# Patient Record
Sex: Female | Born: 1945 | Hispanic: No | Marital: Single | Smoking: Never smoker
Health system: Southern US, Community
[De-identification: ages and names within clinical notes are randomized; demographics above are authoritative.]

## PROBLEM LIST (undated history)

## (undated) DIAGNOSIS — E119 Type 2 diabetes mellitus without complications: Secondary | ICD-10-CM

## (undated) DIAGNOSIS — G589 Mononeuropathy, unspecified: Secondary | ICD-10-CM

## (undated) HISTORY — DX: Mononeuropathy, unspecified: G58.9

## (undated) HISTORY — DX: Type 2 diabetes mellitus without complications: E11.9

---

## 2011-03-25 HISTORY — PX: CATARACT EXTRACTION, BILATERAL: SHX1313

## 2011-08-20 ENCOUNTER — Encounter (INDEPENDENT_AMBULATORY_CARE_PROVIDER_SITE_OTHER): Payer: Self-pay

## 2011-08-20 ENCOUNTER — Ambulatory Visit (INDEPENDENT_AMBULATORY_CARE_PROVIDER_SITE_OTHER): Payer: Commercial Managed Care - PPO | Admitting: Family Medicine

## 2011-08-20 VITALS — BP 112/70 | HR 57 | Temp 98.2°F | Resp 16 | Ht 60.0 in | Wt 137.0 lb

## 2011-08-20 DIAGNOSIS — L237 Allergic contact dermatitis due to plants, except food: Secondary | ICD-10-CM

## 2011-08-20 DIAGNOSIS — L255 Unspecified contact dermatitis due to plants, except food: Secondary | ICD-10-CM

## 2011-08-20 MED ORDER — PREDNISONE 20 MG PO TABS
ORAL_TABLET | ORAL | Status: DC
Start: 2011-08-20 — End: 2011-08-27

## 2011-08-20 NOTE — Patient Instructions (Signed)
Take pred taper as we discussed    No oral OTC meds    If not improving please follow-up
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
## 2011-08-27 ENCOUNTER — Encounter (INDEPENDENT_AMBULATORY_CARE_PROVIDER_SITE_OTHER): Payer: Self-pay

## 2011-08-27 ENCOUNTER — Ambulatory Visit (INDEPENDENT_AMBULATORY_CARE_PROVIDER_SITE_OTHER): Payer: Commercial Managed Care - PPO | Admitting: Family Medicine

## 2011-08-27 VITALS — BP 136/69 | HR 67 | Temp 98.1°F | Resp 18 | Ht 60.0 in | Wt 137.0 lb

## 2011-08-27 DIAGNOSIS — R42 Dizziness and giddiness: Secondary | ICD-10-CM

## 2011-08-27 DIAGNOSIS — L255 Unspecified contact dermatitis due to plants, except food: Secondary | ICD-10-CM

## 2011-08-27 DIAGNOSIS — L237 Allergic contact dermatitis due to plants, except food: Secondary | ICD-10-CM

## 2011-08-27 MED ORDER — PREDNISONE 20 MG PO TABS
ORAL_TABLET | ORAL | Status: AC
Start: 2011-08-27 — End: 2011-09-06

## 2011-08-27 MED ORDER — MECLIZINE HCL 12.5 MG PO TABS
25.00 mg | ORAL_TABLET | Freq: Three times a day (TID) | ORAL | Status: AC | PRN
Start: 2011-08-27 — End: 2011-09-06
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
## 2011-08-27 NOTE — Patient Instructions (Signed)
Vertigo    NEUROLOGIST may also be helpful in making sure no more dangerous cause of episodes of leaning to the right.     Dr. Gilford Raid  [phone]    You have been diagnosed with vertigo.    Vertigo means "the feeling of spinning." People with vertigo have an intense feeling that the room is spinning. This is often called "dizziness."    Most of the time the cause of vertigo is not serious. The most common cause is a balance problem in the inner ear.    The usual treatment is medication to help relieve the spinning feeling and to control nausea.    DO NOT drive a motor vehicle or operate any other equipment that requires concentration until your symptoms have resolved. Be very careful going up and down stairs.    If your symptoms continue your doctor may order an MRI of your brain to make sure there is not a more serious cause of your vertigo.    YOU SHOULD SEEK MEDICAL ATTENTION IMMEDIATELY, EITHER HERE OR AT THE NEAREST EMERGENCY DEPARTMENT, IF ANY OF THE FOLLOWING OCCURS:   You feel numbness, tingling, or weakness in your arms or legs or become unable to walk.   Your symptoms become worse, even with medication.   You have a severe headache.   You have vomiting that makes it hard to take or keep down medication.

## 2013-02-21 ENCOUNTER — Encounter (INDEPENDENT_AMBULATORY_CARE_PROVIDER_SITE_OTHER): Payer: Self-pay

## 2013-02-21 ENCOUNTER — Ambulatory Visit (INDEPENDENT_AMBULATORY_CARE_PROVIDER_SITE_OTHER): Payer: Commercial Managed Care - PPO | Admitting: Adult Health

## 2013-02-21 VITALS — BP 91/53 | HR 67 | Temp 97.9°F | Resp 16 | Ht 60.0 in | Wt 127.0 lb

## 2013-02-21 DIAGNOSIS — Z029 Encounter for administrative examinations, unspecified: Secondary | ICD-10-CM

## 2013-02-21 DIAGNOSIS — J209 Acute bronchitis, unspecified: Secondary | ICD-10-CM

## 2013-02-21 DIAGNOSIS — H6690 Otitis media, unspecified, unspecified ear: Secondary | ICD-10-CM

## 2013-02-21 DIAGNOSIS — H6693 Otitis media, unspecified, bilateral: Secondary | ICD-10-CM

## 2013-02-21 DIAGNOSIS — R059 Cough, unspecified: Secondary | ICD-10-CM

## 2013-02-21 MED ORDER — GUAIFENESIN-CODEINE 100-10 MG/5ML PO SOLN
5.0000 mL | Freq: Three times a day (TID) | ORAL | Status: DC | PRN
Start: 2013-02-21 — End: 2023-07-08

## 2013-02-21 MED ORDER — AMOXICILLIN-POT CLAVULANATE 875-125 MG PO TABS
1.00 | ORAL_TABLET | Freq: Two times a day (BID) | ORAL | Status: AC
Start: 2013-02-21 — End: 2013-03-03

## 2013-02-21 MED ORDER — ALBUTEROL SULFATE HFA 108 (90 BASE) MCG/ACT IN AERS
2.00 | INHALATION_SPRAY | Freq: Four times a day (QID) | RESPIRATORY_TRACT | Status: AC | PRN
Start: 2013-02-21 — End: 2014-02-21

## 2013-02-21 NOTE — Progress Notes (Signed)
Subjective:       Patient ID: Adrienne Wang is a 67 y.o. female.  Chief Complaint   Patient presents with   . URI     patient states she gets a deep cough about once a year, started Tuesday night of last week, had headache and felt weak, was in bed 2.5 days, no flu vaccine       URI   This is a new problem. The current episode started in the past 7 days. The problem has been gradually worsening. There has been no fever. Associated symptoms include congestion, coughing, headaches, rhinorrhea, sinus pain, a sore throat and swollen glands. She has tried nothing for the symptoms. The treatment provided no relief.       The following portions of the patient's history were reviewed and updated as appropriate: allergies, current medications, past family history, past medical history, past social history, past surgical history and problem list.    Review of Systems   Constitutional: Positive for chills, activity change and fatigue.   HENT: Positive for congestion, rhinorrhea and sore throat.    Respiratory: Positive for cough.    Neurological: Positive for headaches.   All other systems reviewed and are negative.            Objective:     Physical Exam   Constitutional: She appears well-developed and well-nourished. She is active. She appears ill.   HENT:   Head: Normocephalic.   Right Ear: Tympanic membrane is bulging.   Left Ear: Tympanic membrane is erythematous and bulging.   Nose: Mucosal edema and rhinorrhea present.   Mouth/Throat: Uvula is midline. Posterior oropharyngeal edema and posterior oropharyngeal erythema present.   Eyes: Pupils are equal, round, and reactive to light.   Cardiovascular: Normal rate, regular rhythm, S1 normal and S2 normal.    Pulmonary/Chest: Effort normal. She has decreased breath sounds in the right middle field, the right lower field, the left middle field and the left lower field. She has wheezes in the right middle field, the right lower field, the left middle field and the left lower  field.   Lymphadenopathy:        Head (right side): Submandibular and tonsillar adenopathy present.        Head (left side): Submandibular and tonsillar adenopathy present.   Neurological: She is alert.   Skin: Skin is warm, dry and intact.           Assessment:       1. Cough     2. Otitis media, bilateral  amoxicillin-clavulanate (AUGMENTIN) 875-125 MG per tablet    albuterol (PROVENTIL HFA;VENTOLIN HFA) 108 (90 BASE) MCG/ACT inhaler    Guaifenesin-Codeine (ROBITUSSIN W CODEINE) 100-10 MG/5ML syrup   3. Acute bronchitis  amoxicillin-clavulanate (AUGMENTIN) 875-125 MG per tablet    albuterol (PROVENTIL HFA;VENTOLIN HFA) 108 (90 BASE) MCG/ACT inhaler    Guaifenesin-Codeine (ROBITUSSIN W CODEINE) 100-10 MG/5ML syrup           Plan:        Medicines as prescribed .    Follow-up w/ PMD

## 2013-02-21 NOTE — Patient Instructions (Signed)
Bronchitis With Wheezing (Viral Or Bacterial: Adult)    Bronchitis is an infection of the air passages. It often occurs during the common cold and is usually caused by a virus. Symptoms include cough with mucus (phlegm) and low-grade fever.  If there is a lot of inflammation, air flow is restricted. The air passages may also go into spasm, especially if you are an asthmatic. This causes wheezing and difficulty breathing even in persons who do not have asthma.  Bronchitis usually lasts 7-14 days. The wheezing should improve with treatment during the first week. An inhaler is often prescribed to relax the air passages and stop wheezing. Antibiotics will be prescribed if your doctor thinks there is also a secondary bacterial infection.  Home Care:   If symptoms are severe, rest at home for the first 2-3 days. When resuming activity, don't let yourself become overly tired.   Do not smoke and avoid exposure to the smoke of others.   You may use acetaminophen (Tylenol) or ibuprofen (Motrin, Advil) to control fever, unless another medicine was prescribed. [NOTE: If you have chronic liver or kidney disease or ever had a stomach ulcer or GI bleeding, talk with your doctor before using these medicines.] (Aspirin should never be used in anyone under 18 years of age who is ill with a fever. It may cause severe liver damage.)   Your appetite may be poor so a light diet is fine. Avoid dehydration by drinking 6-8 glasses of fluids per day (water, soft, drinks, juices, tea, soup, etc.). Extra fluids will help loosen secretions in the lungs.   Over-the-counter cough medicines that contain"dextromethorphan"(such as Robitussin DM) and decongestants (Actifed or Sudafed) may help relieve cough and congestion. [NOTE: Do not use decongestants if you have high blood pressure.]   If you were given an inhaler, use it exactly as directed. If you need to use it more often than prescribed, your condition may be worsening. Contact your  doctor or this facility.   If prescribed, finish all antibiotic medicine, even if you are feeling better after only a few days.  Follow Up  With Your Doctor Or As Directed If You Are Not Starting To Feel Better After Three Days.  [NOTE: If you are age 65 or older, or if you have chronic asthma or COPD, we recommend a pneumococcal vaccination every five years and a yearly influenza vaccination (flu shot) every autumn. Ask your doctor about this. If you had an x-ray or EKG (electrocardiogram), it will be reviewed by a specialist. You will be notified of any new findings that may affect your care.]  Get Prompt Medical Attention If Any Of The Following Occur:   Increased wheezing, shortness of breath or pain with breathing   Fever of 100.4F (38C) oral or higher, not better with fever medication   Coughing up blood or increasing amounts of colored sputum   Weakness, drowsiness, headache, facial pain, ear pain or a stiff neck   Lower leg swelling, tenderness, redness or pain   2000-2014 [employer], [address], [place] [address]. All rights reserved. This information is not intended as a substitute for professional medical care. Always follow your healthcare professional's instructions.      Sinusitis [Abx Tx]    The sinuses are air-filled spaces within the bones of the face. They connect to the inside of the nose. Sinusitis is an inflammation of the tissue lining the sinus cavity. Sinus inflammation can occur during a cold or hay-fever (allergies to   pollens and other particles in the air) and cause symptoms of sinus congestion and fullness. A sinus infection causes fever, headache and facial pain. There is usually green or yellow drainage from the nose or into the back of the throat (post-nasal drip). Antibiotics are prescribed to treat this condition.  Home Care:   Drink plenty of water, hot tea, and other liquids to stay well hydrated. This thins the mucus and promotes sinus drainage.   Apply  heat to the painful areas of the face. Use a towel soaked in hot water. Or, stand in the shower and direct the hot spray onto your face. This is a good way to inhale warm water vapor and get heat on your face at the same time. (Cover your mouth and nose with your hands so you can still breathe as you do this.)   Use a vaporizer with products such as Vicks VapoRub (contains menthol) at night. Suck on peppermint, menthol or eucalyptus hard candies during the day.   An expectorant containing guaifenesin (such as Robitussin), helps to thin the mucus and promote drainage from the sinuses.   Over-the-counter decongestants may be used unless a similar medicine was prescribed. Nasal sprays work the fastest. Use one that contains phenylephrine (Neo-synephrine, Sinex and others) or oxymetazoline (Afrin). First blow the nose gently to remove mucus, then apply the drops. Do not use these medicines more often than directed on the label or for more than three days or symptoms may worsen. You may also use tablets containing pseudoephedrine (Sudafed). Many sinus remedies combine ingredients, which may increase side effects. Read the labels or ask the pharmacist for help. NOTE: Persons with high blood pressure should not use decongestants. They can raise blood pressure.   Antihistamines are useful if allergies are a cause of your sinusitis. The mildest one is chlorpheniramine (available without a prescription). The dose for adults is 8-12mg three times a day. [NOTE: Do not use chlorpheniramine if you have glaucoma or if you are a man with trouble urinating due to an enlarged prostate.] Claritin (loratidine) is an antihistamine that causes less drowsiness and is a good alternative for daytime use.   Do not use nasal rinses or irrigation during an acute sinus infection, unless advised by your doctor. Rinsing may spread the infection to other sinuses.   You may use acetaminophen (Tylenol) or ibuprofen (Motrin, Advil) to control  pain, unless another pain medicine was prescribed. [ NOTE: If you have chronic liver or kidney disease or ever had a stomach ulcer, talk with your doctor before using these medicines.] (Aspirin should never be used in anyone under 18 years of age who is ill with a fever. It may cause severe liver damage.)   Finish the full course, even if you are feeling better after a few days.  Follow Up  with your doctor or this facility in one week or as instructed by our staff if not improving.  Get Prompt Medical Attention  if any of the following occur:   Facial pain or headache becomes more severe   Stiff neck   Unusual drowsiness or confusion, or not acting like your normal self   Swelling of the forehead or eyelids   Vision problems including blurred or double vision   Fever of 100.4F (38C) or higher, or as directed by your healthcare provider   Seizure   2000-2014 [employer], [address], [place] [address]. All rights reserved. This information is not intended as a substitute for   professional medical care. Always follow your healthcare professional's instructions.

## 2013-06-07 ENCOUNTER — Ambulatory Visit (INDEPENDENT_AMBULATORY_CARE_PROVIDER_SITE_OTHER): Payer: Commercial Managed Care - PPO | Admitting: Family Medicine

## 2013-06-07 ENCOUNTER — Encounter (INDEPENDENT_AMBULATORY_CARE_PROVIDER_SITE_OTHER): Payer: Self-pay | Admitting: Family

## 2013-06-07 VITALS — BP 104/64 | HR 71 | Temp 97.3°F | Resp 16 | Ht 60.0 in | Wt 129.0 lb

## 2013-06-07 DIAGNOSIS — S0990XA Unspecified injury of head, initial encounter: Secondary | ICD-10-CM
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
## 2013-06-07 NOTE — Patient Instructions (Signed)
Head Injury, No Wake-Up (Adult)    You have had a head injury. It does not appear serious at this time. Symptoms of a more serious problem (concussion, bruising, or bleeding in the brain) may appear later. Therefore, watch for the WARNING SIGNS listed below.  Home Care:   Your healthcare provider will tell you whether it's okay to drive. If so, you can drive yourself home. For the next day or so, be careful when driving or using heavy machinery until you are sure you have no delayed symptoms.   During the next 24 hours someone must stay with you to check for the signs below. It is not necessary to stay awake or be awakened during the night.   If you have swelling of the face or scalp, apply an ice pack (ice cubes in a plastic bag, wrapped in a towel) for 20 minutes. Do this every 1-2 hours until the swelling starts to go down.   Do not use aspirin or ibuprofen (Motrin, Advil) after a head injury.You may use acetaminophen (Tylenol)to control pain, unless another pain medicine was prescribed. [NOTE: If you have chronic liver or kidney disease or ever had a stomach ulcer or GI bleeding, talk with your doctor before using these medicines.]   For the next 24 hours:   Do not take alcohol, sedatives or medicines that make you sleepy.   Avoid strenuous activities. No lifting or straining.   If you have had any symptoms of a concussion today (nausea, vomiting, dizziness, confusion, headache, memory loss or if you were knocked out), do not return to sports or any activity that could result in another head injury until all symptoms are gone and you have been cleared by your doctor. A second head injury before fully recovering from the first one can lead to serious brain injury.  Follow Up  with your doctor if symptoms are not improving after 24 hours, or as directed.    Concussion [No Wake-Up]    A concussion occurs when there is a blow to the head with enough force to shake up the brain. This may cause a loss of  consciousness ("knocked out"), but not always. Depending on how hard you hit your head, it will take from a few hours up to a few days to get better. Sometimes symptoms may last a few months or longer ("post-concussion syndrome").  Initially, it is common to have symptoms of headache, nausea, vomiting or dizziness. You may also notice difficulty concentrating or problems with memory. This is normal.  Symptoms should get better as the hours and days go by. Symptoms that worsen could be a sign of a more serious injury (bruise or bleeding in the brain). Therefore, watch for the warning signs below.  Home Care:  1. During the next 24 hours someone must stay with you to check for the signs below.  2. If you have swelling of the face or scalp, apply an ice pack (ice cubes in a plastic bag, wrapped in a towel) for 20 minutes every one to two hours until the swelling starts to go down.  3. You may use acetaminophen (Tylenol) or ibuprofen (Motrin, Advil) to control pain, unless another pain medicine was prescribed. [NOTE: If you have chronic liver or kidney disease or ever had a stomach ulcer or GI bleeding, talk with your doctor before using these medicines.] Do not use ibuprofen in children under six months of age.  4. For the next 24 hours:   Do not  take alcohol, sedatives or medicines that make you sleepy.   Do not drive or operate machinery.   Avoid strenuous activities. No lifting or straining.  5. Do not return to sports or any activity that could result in another head injury until all symptoms are gone and you have been cleared by your doctor. A second head injury before fully recovering from the first one can lead to serious brain injury.  Follow Up  with your doctor in one week, or as directed.    Get Prompt Medical Attention  if any of the following occur:   Repeated vomiting   Severe or worsening headache or dizziness   Unusual drowsiness, or unable to awaken as usual   Confusion or change in behavior or  speech, memory loss, blurred vision   Convulsion (seizure)   Increasing scalp or face swelling   Redness, warmth or pus from the swollen area   Fluid drainage or bleeding from the nose or ears   [address], [address] [place], [place] [address]. All rights reserved. This information is not intended as a substitute for professional medical care. Always follow your healthcare professional's instructions.      Get Prompt Medical Attention  if any of the followingWARNING SIGNS occur:   Repeated vomiting   Severe or worsening headache or dizziness   Unusual drowsiness, or unable to awaken as usual   Confusion or change in behavior or speech, memory loss, blurred vision   Convulsion (seizure)   Increasing scalp or face swelling   Redness, warmth or pus from the swollen area   Fluid drainage or bleeding from the nose or ears   [address], [address], [place], [place] [address]. All rights reserved. This information is not intended as a substitute for professional medical care. Always follow your healthcare professional's instructions.

## 2019-05-09 NOTE — Progress Notes (Deleted)
[facility]

## 2019-05-10 ENCOUNTER — Ambulatory Visit (INDEPENDENT_AMBULATORY_CARE_PROVIDER_SITE_OTHER): Payer: Medicare Other | Admitting: Internal Medicine

## 2019-05-10 ENCOUNTER — Encounter (INDEPENDENT_AMBULATORY_CARE_PROVIDER_SITE_OTHER): Payer: Self-pay | Admitting: Internal Medicine

## 2019-05-10 ENCOUNTER — Encounter (INDEPENDENT_AMBULATORY_CARE_PROVIDER_SITE_OTHER): Payer: Self-pay

## 2019-05-10 VITALS — BP 125/79 | HR 58 | Temp 98.1°F | Resp 14 | Ht 59.0 in | Wt 132.2 lb

## 2019-05-10 DIAGNOSIS — M79642 Pain in left hand: Secondary | ICD-10-CM

## 2019-05-10 DIAGNOSIS — G8929 Other chronic pain: Secondary | ICD-10-CM

## 2019-05-10 DIAGNOSIS — M19042 Primary osteoarthritis, left hand: Secondary | ICD-10-CM

## 2019-05-10 DIAGNOSIS — M2012 Hallux valgus (acquired), left foot: Secondary | ICD-10-CM

## 2019-05-10 DIAGNOSIS — M79672 Pain in left foot: Secondary | ICD-10-CM

## 2019-05-10 DIAGNOSIS — M2011 Hallux valgus (acquired), right foot: Secondary | ICD-10-CM

## 2019-05-10 DIAGNOSIS — M151 Heberden's nodes (with arthropathy): Secondary | ICD-10-CM

## 2019-05-10 DIAGNOSIS — M79671 Pain in right foot: Secondary | ICD-10-CM

## 2019-05-10 DIAGNOSIS — M79641 Pain in right hand: Secondary | ICD-10-CM

## 2019-05-10 DIAGNOSIS — M19041 Primary osteoarthritis, right hand: Secondary | ICD-10-CM

## 2019-05-10 NOTE — Patient Instructions (Addendum)
Dear Adrienne Wang ,     It was lovely to see you in clinic today.    For your joint pain you can try over the counter tylenol or ibuprofen as needed.    Here are things I'd like you to do following today's visit:  Please have blood taken for labs.  Please have x-rays of your hands and feet    We will discuss your results at your next appointment. Please set up that appointment on your way out. If there are any extraordinary abnormal results, you will hear from me or our staff here in the office.    Sincerely,    Debe Coder, MD  Rheumatologist  [facility]  [phone]

## 2019-05-10 NOTE — Progress Notes (Signed)
RHEUMATOLOGY NEW PATIENT  NOTE    Chief Complaint   Patient presents with   . Joint Pain     B/L hands, fingers heberdons nodules, B/L feet swelling and pain, difficulty ambulating, onset sx 2 yrs ago        PCP: Pcp, None, MD    HPI:    Adrienne Wang is a 74 y.o. female with past medical history of hand osteoarthritis, hypertension, prior cataract surgery who presents to the rheumatology clinic for follow up evaluation of reported osteoarthritis and worsening hand hypertrophy as well as foot pain.    Patient was previously seen by this provider at [facility] and is transitioning her care to [facility].    She reports bilateral midfoot pain, worse with walking.  She says sometimes it gets better with rest.    She says her hands bothers her with movement.  She says it can "get inflamed" every 2-3 months.    She does not take any medicines for the joint pain.    She lives with her brother and sister.  She denies tobacco, alcohol, recreational drug use.    Family History reviewed and non contributory.    General: Denies fevers, chills, night sweats, unintentional weight loss.  HEENT: Denies vision changes, eye pain, red eye, dry eyes, dry mouth, uveitis, hearing changes, difficulty or painful swallowing, swollen lymph nodes, oral or nasal ulcers.  Cardiovascular: Denies chest pain, dyspnea, palpitations  Pulmonary: Denies cough, wheezing  Gastrointestinal: Denies abdominal pain, diarrhea, hematochezia  Genitourinary: Denies dysuria, hematuria  Musculoskeletal: Denies dactylitis, enthesitis, podagra, Raynaud's phenomenon  Dermatologic: Denies skin changes, rashes, psoriasis, alopecia, photosensitive rash  Neurologic: Denies muscle weakness, paresthesias  Miscellaneous: Denies history of blood clots, seizures, strokes, pleural or pericardial effusions  Denies history of miscarriages, pregnancy complications        The following sections were reviewed this encounter by the provider:   Tobacco  Allergies   Meds  Problems  Med Hx  Surg Hx  Fam Hx         PMH/PSH:  Past Medical History:   Diagnosis Date   . Diabetes mellitus    . Pinched nerve     lumbar         Past Surgical History:   Procedure Laterality Date   . CATARACT EXTRACTION, BILATERAL  2013        FH/SH:  Family History   Problem Relation Age of Onset   . Heart disease Mother    . Heart disease Father        Social History     Tobacco Use   . Smoking status: Never Smoker   . Smokeless tobacco: Never Used   Substance Use Topics   . Alcohol use: No   . Drug use: No        Meds/ Allergies:  Outpatient Medications Marked as Taking for the 05/10/19 encounter (Office Visit) with Danella Deis, MD   Medication Sig Dispense Refill   . Diphenhydramine-Zinc Acetate (BENADRYL ITCH RELIEF EX) Apply topically.     . Guaifenesin-Codeine (ROBITUSSIN W CODEINE) 100-10 MG/5ML syrup Take 5 mLs by mouth 3 (three) times daily as needed for Cough. 120 mL 0   . Hydrocortisone (CVS CORTISONE INTENSIVE HEAL EX) Apply topically.       Allergies   Allergen Reactions   . Aspirin      Does not take aspirin.          PHYSICAL EXAM  Vitals:  05/10/19 1415   BP: 125/79   Pulse: (!) 58   Resp: 14   Temp: 98.1 F (36.7 C)        General: In No Apparent Distress, non toxic appearing, Appears Stated Age, Alert and Oriented  HEENT: Normocephalic, Atraumatic, Anicteric Sclerae, Pinna appear normal without erythema or edema  Cardiovascular: Normal S1, S2, no murmurs, rubs or gallops, regular heart rate  Pulmonary: No conversational dyspnea, breathing comfortably on room air, Clear to Auscultation Bilaterally, no wheezing, rhonchi or rales  Neurologic: CN 3-12 Intact, Moving all Extremities Equally, 5/5 Muscle Strength with Hand Grip, Shoulder Abduction/Adduction, Elbow Flexion/Extension, hip flexion, knee flexion/extension, ankle dorsiflexion/plantar flexion bilaterally, No cerebellar dysmetria, Ambulates Normally  Extremities: Warm, Well Perfused, No edema, 2+ palpable radial/DP  pulses bilaterally  Dermatologic: No malar rash, no discoid lesions, No psoriasis plaques, No nail dystrophy, nail onychomycosis    MSK:     DIP:No synovitis, heberdens nodes/bony hypertrophy  PIP: No synovitis, bouchards nodes/bony hypertrophy  MCP: No synovitis and full range of motion  Wrists: CMC Hypertrophy bilaterally, no swelling  Elbows: No effusions, bursitis and full range of motion  Shoulders: Full Range of Motion  Ankles: No swelling, Full Range of Motion  MTP: R hallux valgus  Feet: mild pain with tarsal squeeze    LABS:  No results found for: WBC, HGB, HCT, MCV, PLT   No results found for: CREAT, BUN, NA, K, CL, CO2   No results found for: ALT, AST, GGT, ALKPHOS, BILITOTAL  No results found for: ESR   No results found for: CRP   No results found for: ANA   No results found for: RHEUMFACTOR   No results found for: CCP, CCPANTIBODYI  No results found for: URICACID      ASSESSMENT/PLAN:    Adrienne Wang is a 74 y.o. female with past medical history of hand osteoarthritis, hypertension, prior cataract surgery who presents to the rheumatology clinic for follow up evaluation of reported osteoarthritis and worsening hand hypertrophy as well as foot pain.    Patient endorsing mechanical hand and foot pain with significant heberdens and bouchards nodes, CMC Hypertrophy most likely due to erosive hand soteoarthritis.  Will obtain Hand and Feet XRays and serologies to rule out rheumatoid arthritis.    She was advised to utilize OTC tylenol or NSAIDs for now.      1. Pain in both hands  - XR Hand Right PA And Lateral; Future  - XR Hand Left PA And Lateral; Future  - XR Foot Left AP And Lateral; Future  - XR Foot Right AP And Lateral; Future  - XR Hand Right PA And Lateral  - XR Hand Left PA And Lateral  - XR Foot Left AP And Lateral  - XR Foot Right AP And Lateral  - CBC and differential; Future  - Comprehensive metabolic panel; Future  - Sedimentation rate (ESR); Future  - C Reactive Protein; Future  -  Rheumatoid factor; Future  - Cyclic Citrullinated Peptide Abs IgG/IgA; Future  - C3 Complement; Future  - C4 complement; Future  - Urinalysis; Future  - Uric acid; Future  - ANA, IFA; Future  - ANA Comprehensive Panel; Future    2. Chronic pain of both feet  - XR Hand Right PA And Lateral; Future  - XR Hand Left PA And Lateral; Future  - XR Foot Left AP And Lateral; Future  - XR Foot Right AP And Lateral; Future  - XR Hand Right PA And  Lateral  - XR Hand Left PA And Lateral  - XR Foot Left AP And Lateral  - XR Foot Right AP And Lateral  - CBC and differential; Future  - Comprehensive metabolic panel; Future  - Sedimentation rate (ESR); Future  - C Reactive Protein; Future  - Rheumatoid factor; Future  - Cyclic Citrullinated Peptide Abs IgG/IgA; Future  - C3 Complement; Future  - C4 complement; Future  - Urinalysis; Future  - Uric acid; Future  - ANA, IFA; Future  - ANA Comprehensive Panel; Future    3. Heberden's node  - CBC and differential; Future  - Comprehensive metabolic panel; Future  - Sedimentation rate (ESR); Future  - C Reactive Protein; Future  - Rheumatoid factor; Future  - Cyclic Citrullinated Peptide Abs IgG/IgA; Future  - C3 Complement; Future  - C4 complement; Future  - Urinalysis; Future  - Uric acid; Future  - ANA, IFA; Future  - ANA Comprehensive Panel; Future    4. Valgus deformity of both great toes  - CBC and differential; Future  - Comprehensive metabolic panel; Future  - Sedimentation rate (ESR); Future  - C Reactive Protein; Future  - Rheumatoid factor; Future  - Cyclic Citrullinated Peptide Abs IgG/IgA; Future  - C3 Complement; Future  - C4 complement; Future  - Urinalysis; Future  - Uric acid; Future  - ANA, IFA; Future  - ANA Comprehensive Panel; Future    5. Primary osteoarthritis of both hands  - CBC and differential; Future  - Comprehensive metabolic panel; Future  - Sedimentation rate (ESR); Future  - C Reactive Protein; Future  - Rheumatoid factor; Future  - Cyclic Citrullinated  Peptide Abs IgG/IgA; Future  - C3 Complement; Future  - C4 complement; Future  - Urinalysis; Future  - Uric acid; Future  - ANA, IFA; Future  - ANA Comprehensive Panel; Future           FOLLOW UP:  Return in about 2 weeks (around 05/24/2019) for Follow Up, 2-4 weeks telemed.      Total of 35 minutes spent reviewing patient records, interviewing patient and coordinating plan of care.      Debe Coder, MD   Rheumatologist  [address]  [address]   [place], [place] [address]  P [phone]  F [phone]

## 2019-06-03 ENCOUNTER — Telehealth (INDEPENDENT_AMBULATORY_CARE_PROVIDER_SITE_OTHER): Payer: Medicare Other | Admitting: Internal Medicine

## 2020-05-09 ENCOUNTER — Encounter (INDEPENDENT_AMBULATORY_CARE_PROVIDER_SITE_OTHER): Payer: Self-pay | Admitting: Internal Medicine

## 2020-05-09 DIAGNOSIS — M2011 Hallux valgus (acquired), right foot: Secondary | ICD-10-CM

## 2020-05-09 DIAGNOSIS — M19042 Primary osteoarthritis, left hand: Secondary | ICD-10-CM

## 2020-05-09 DIAGNOSIS — M79642 Pain in left hand: Secondary | ICD-10-CM

## 2020-05-09 DIAGNOSIS — M2012 Hallux valgus (acquired), left foot: Secondary | ICD-10-CM

## 2020-05-09 DIAGNOSIS — G8929 Other chronic pain: Secondary | ICD-10-CM

## 2020-05-09 DIAGNOSIS — M151 Heberden's nodes (with arthropathy): Secondary | ICD-10-CM

## 2021-10-16 IMAGING — MR MRI TSPINE WO CONTRAST
6 series · 35 of 48 positions shown · non-contrast
Comparison: MRI thoracic spine study 07/10/2011.

HISTORY: 74 year-old female with mid back pain .
TECHNIQUE: MRI study of the thoracic spine was performed using sagittal and axial images of varying sequences. The examination is performed without contrast.

[Series 3: t2_sag/cor loc t · coronal · 4.0mm · 0.94mm/px · 6 of 16 slices shown]
[im 1/16]
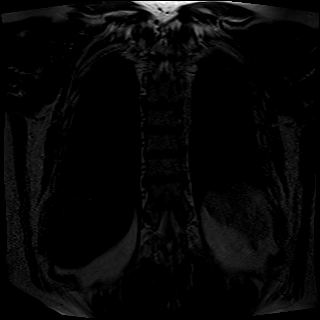
[im 4/16]
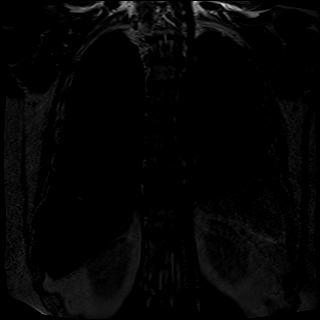
[im 7/16]
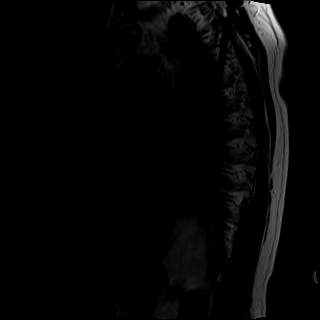
[im 10/16]
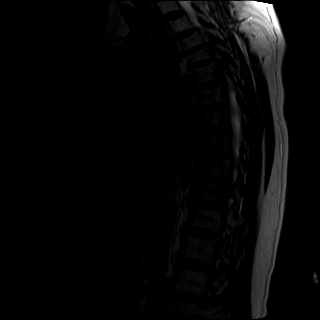
[im 13/16]
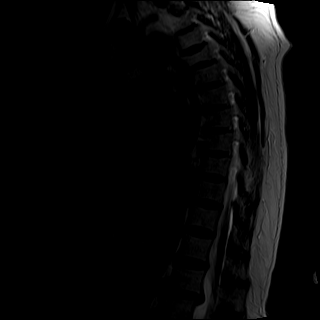
[im 16/16]
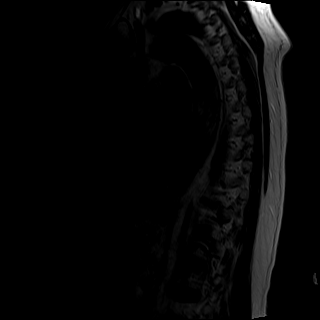

[Series 5: t2_sag loc c · sagittal · 3.0mm · 0.94mm/px · 4 of 11 slices shown]
[im 1/11]
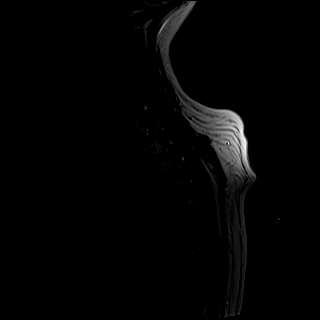
[im 4/11]
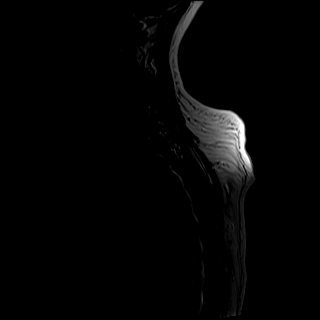
[im 7/11]
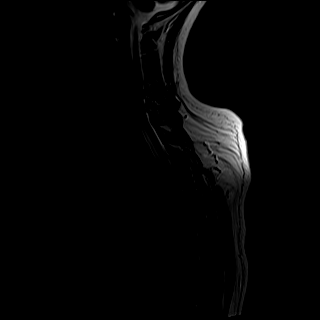
[im 11/11]
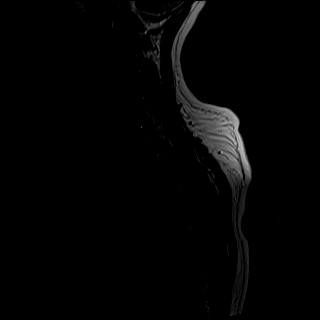

[Series 6: t2_sag · sagittal · 3.0mm · 0.78mm/px · 6 of 17 slices shown]
[im 1/17]
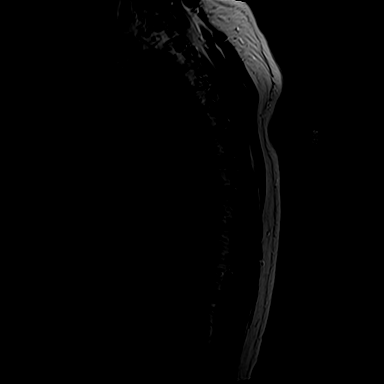
[im 4/17]
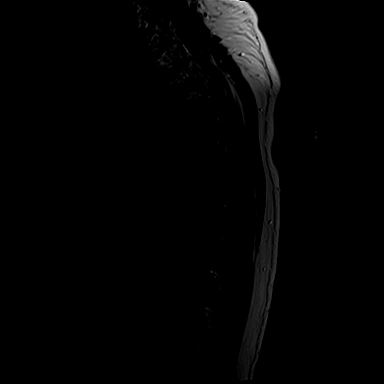
[im 7/17]
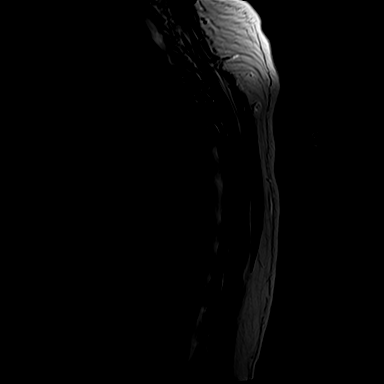
[im 10/17]
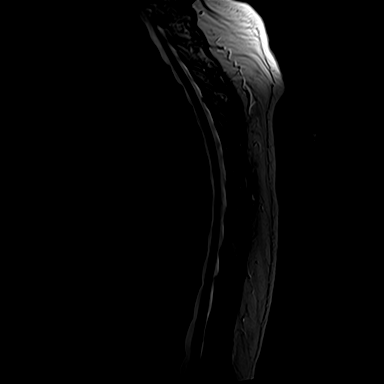
[im 13/17]
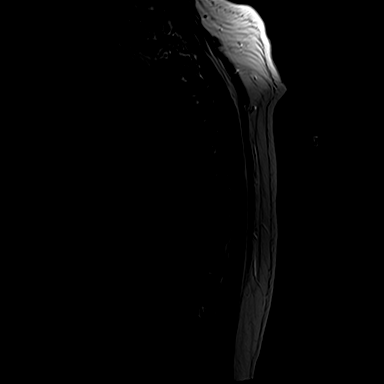
[im 17/17]
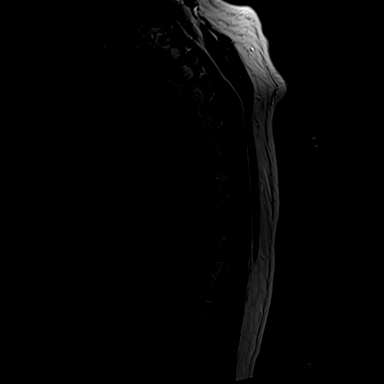

[Series 7: t1_sag · sagittal · 3.0mm · 0.94mm/px · 6 of 17 slices shown]
[im 1/17]
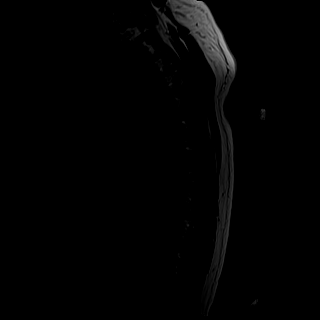
[im 4/17]
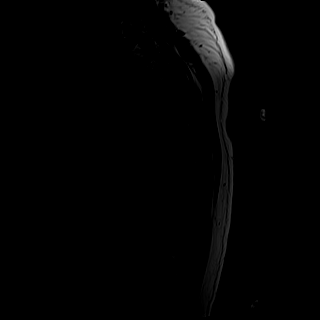
[im 7/17]
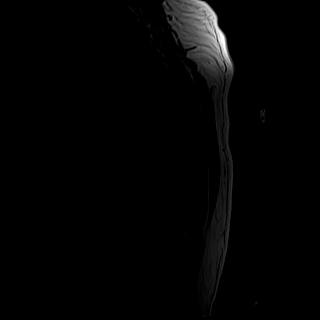
[im 10/17]
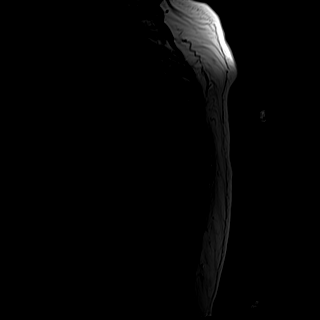
[im 13/17]
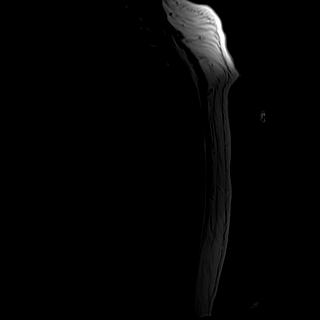
[im 17/17]
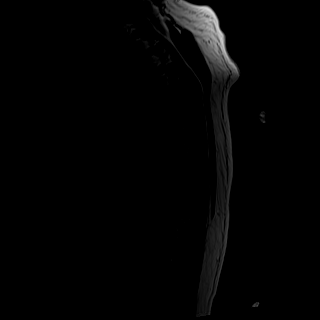

[Series 8: ir_sag · sagittal · 3.0mm · 1.17mm/px · 6 of 17 slices shown]
[im 1/17]
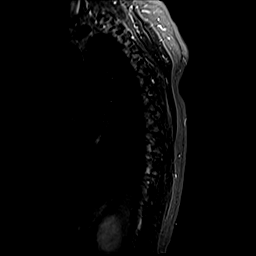
[im 4/17]
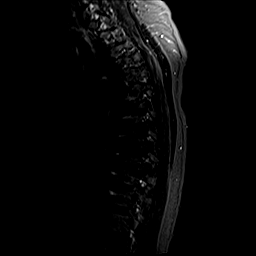
[im 7/17]
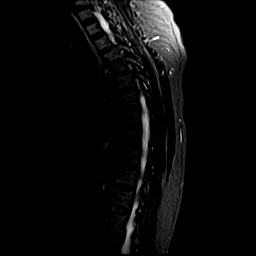
[im 10/17]
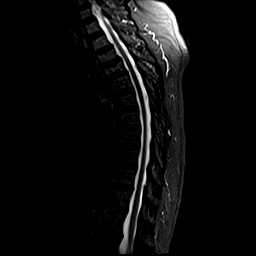
[im 13/17]
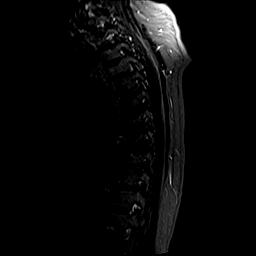
[im 17/17]
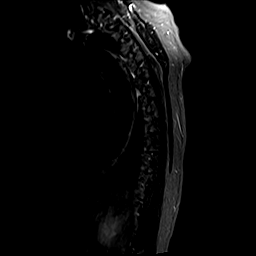

[Series 5002: t2_axial · axial · 4.0mm · 0.29mm/px · z∈[-152,+51]mm · 7 of 56 slices shown]
[im 3/56]
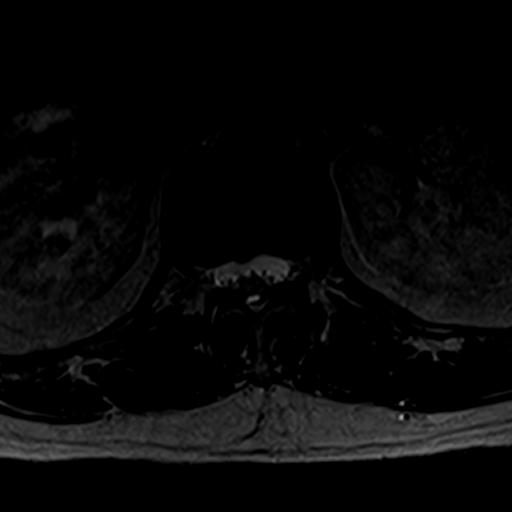
[im 9/56]
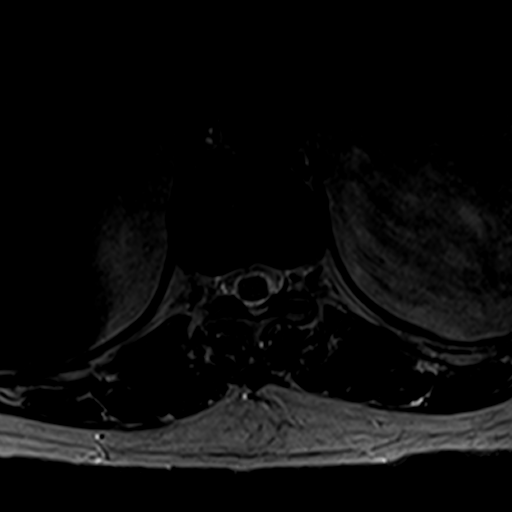
[im 18/56]
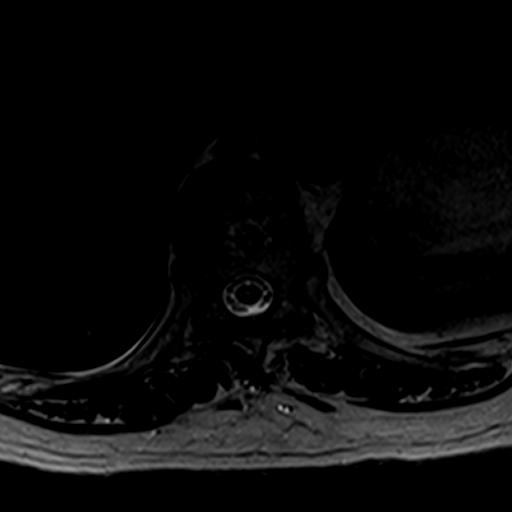
[im 24/56]
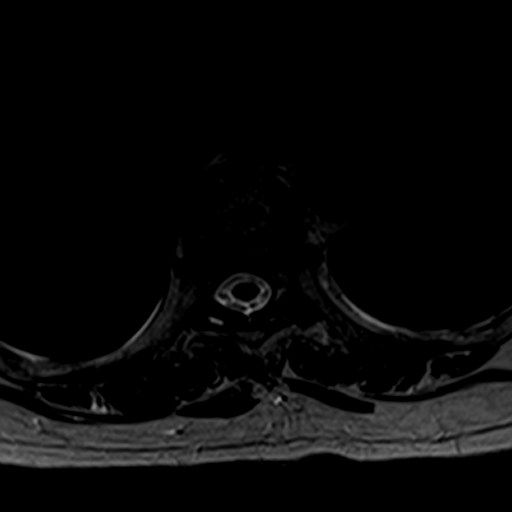
[im 32/56]
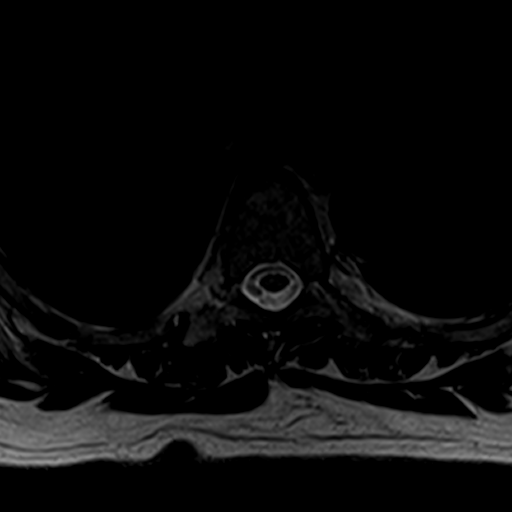
[im 38/56]
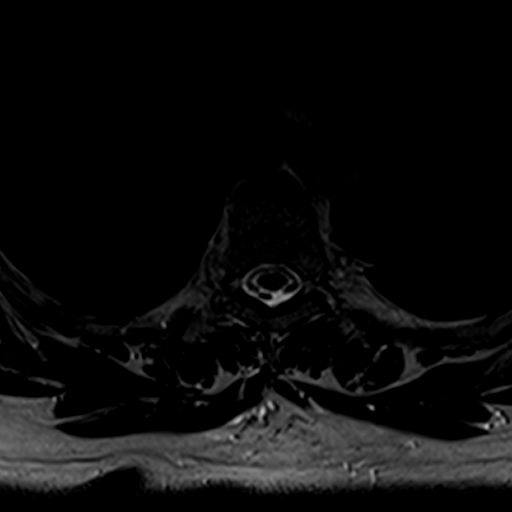
[im 47/56]
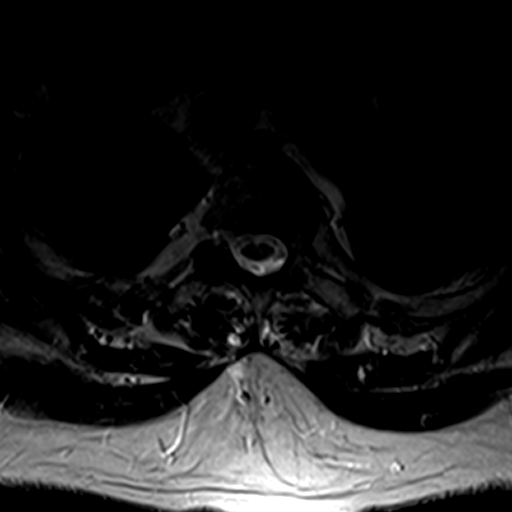

[35 of 48 positions shown; findings below may reference images not displayed]

FINDINGS: Vertebral body height and alignment: There is old compression fracture deformity involving T3 and T4 vertebral bodies unchanged compared to prior examination.

Marrow signal: The marrow signal is unremarkable.

Degenerative disc changes: No significant degenerative disc changes are present.

Thoracic cord: Within normal limits.

T1-2: The spinal canal and the neural foramina are patent.

T2-3: The spinal canal and the neural foramina are patent.

T3-4: The spinal canal and the neural foramina are patent.

T4-5: The spinal canal and the neural foramina are patent.

T5-6: The spinal canal and the neural foramina are patent.

T6-7: The spinal canal and the neural foramina are patent.

T7-8: The spinal canal and the neural foramina are patent.

T8-9: The spinal canal and the neural foramina are patent.

T9-10: The spinal canal and the neural foramina are patent.

T10-11: The spinal canal and the neural foramina are patent.

T11-12: The spinal canal and the neural foramina are patent.

T12-L1: There is a small disc protrusion. The spinal canal and the neural foramina are patent.
IMPRESSION: 1. No MRI evidence of severe thoracic spinal canal stenosis, disc herniation or neural foraminal impingement.

2. Small disc protrusion T12-L1 level.

3. The thoracic cord demonstrates normal signal.

4. Old compression fracture deformity involving T3 and T4 vertebral bodies. There is no evidence of acute compression fracture deformity involving the thoracic spine.

## 2022-04-14 ENCOUNTER — Other Ambulatory Visit: Payer: Self-pay | Admitting: Orthopaedic Surgery

## 2022-12-14 ENCOUNTER — Emergency Department
Admission: EM | Admit: 2022-12-14 | Discharge: 2022-12-14 | Disposition: A | Discharge status: Home / Self Care | Payer: Medicare Other | Attending: Emergency Medicine | Admitting: Emergency Medicine

## 2022-12-14 ENCOUNTER — Emergency Department: Payer: Medicare Other

## 2022-12-14 DIAGNOSIS — M25531 Pain in right wrist: Secondary | ICD-10-CM | POA: Insufficient documentation

## 2022-12-14 DIAGNOSIS — M25532 Pain in left wrist: Secondary | ICD-10-CM | POA: Insufficient documentation

## 2022-12-14 NOTE — Discharge Instructions (Signed)
Dear Adrienne Wang:    Thank you for choosing one of [facility] emergency departments.  I hope your visit today was EXCELLENT.    Specific instructions for your visit today:    Should your symptoms worsen, develop shortness of breath, chest pain, numbness, weakness, abdominal pain, nausea, vomiting, fevers or any other concerning sign or symptom, please return to the Emergency Department.  Otherwise, please follow-up with your primary care physician.      IF YOU DO NOT CONTINUE TO IMPROVE OR YOUR CONDITION WORSENS, PLEASE CONTACT YOUR DOCTOR OR RETURN IMMEDIATELY TO THE EMERGENCY DEPARTMENT.    Sincerely,  Barrie Sigmund *  Attending Emergency Physician  [facility] Emergency Department    OBTAINING A PRIMARY CARE APPOINTMENT    Primary care physicians (PCPs, also known as primary care doctors) are either internists or family medicine doctors. Both types of PCPs focus on health promotion, disease prevention, patient education and counseling, and treatment of acute and chronic medical conditions.    Call for an appointment with a primary care doctor.  Ask to see who is taking new patients.     [facility]  telephone:  [phone]  [email or web address]    For a pediatrician, call the [facility] referral line below.  You can also call to make an appointment at [facility] for [facility] (except [employer] and [facility]):    [address]  [place], [place] [address]  [phone]    [facility]  Call [phone] (available 24 hours a day, 7 days a week) if you need any further referrals and we can help you find a primary care doctor or specialist.  Also, available online at:  [email or web address]    For more information regarding our services at [facility], please call the number above or visit the website [email or web address]    YOUR CONTACT INFORMATION  Before leaving please check with registration to  make sure we have an up-to-date contact number.  You can call registration at [phone], Option 7 [facility] location) or [phone], Option 1 [facility] location) to update your information.  For questions about your hospital bill, please call [phone].  For questions about your Emergency Dept Physician bill please call [phone].      FREE HEALTH SERVICES  If you need help with health or social services, please call 2-1-1 for a free referral to resources in your area.  2-1-1 is a free service connecting people with information on health insurance, free clinics, pregnancy, mental health, dental care, food assistance, housing, and substance abuse counseling.  Also, available online at:  http://www.[facility].org    ORTHOPEDIC INJURY   Please know that significant injuries can exist even when an initial x-ray is read as normal or negative.  This can occur because some fractures (broken bones) are not initially visible on x-rays.  For this reason, close outpatient follow-up with your primary care doctor or bone specialist (orthopedist) is required.    MEDICATIONS AND FOLLOWUP  Please be aware that some prescription medications can cause drowsiness.  Use caution when driving or operating machinery.    The examination and treatment you have received in our Emergency Department is provided on an emergency basis, and is not intended to be a substitute for your primary care physician.  It is important that your doctor checks you again and that you report any new or remaining problems at that time.        ASSISTANCE WITH INSURANCE  Affordable Care Act  (ACA)  Call to start or finish an application, compare plans, enroll or ask a question.  [phone]  TTY: [phone]  Web:  Healthcare.gov    Help Enrolling in [facility]  [employer] [place]  [phone] (TOLL-FREE)  308-886-8399 (TTY)  Web:  Http://www.coverva.org    Local Help Enrolling in the [facility]  [place] [facility]  [phone]  (MAIN)  Email:  health-help@nvfs .org  Web:  BlackjackMyths.is  Address:  [address], [address] [place], [place] [address]    SEDATING MEDICATIONS  Sedating medications include strong pain medications (e.g. narcotics), muscle relaxers, benzodiazepines (used for anxiety and as muscle relaxers), Benadryl/diphenhydramine and other antihistamines for allergic reactions/itching, and other medications.  If you are unsure if you have received a sedating medication, please ask your physician or nurse.  If you received a sedating medication: DO NOT drive a car. DO NOT operate machinery. DO NOT perform jobs where you need to be alert.  DO NOT drink alcoholic beverages while taking this medicine.     If you get dizzy, sit or lie down at the first signs. Be careful going up and down stairs.  Be extra careful to prevent falls.     Never give this medicine to others.     Keep this medicine out of reach of children.     Do not take or save old medicines. Throw them away when outdated.     Keep all medicines in a cool, dry place. DO NOT keep them in your bathroom medicine cabinet or in a cabinet above the stove.    MEDICATION REFILLS  Please be aware that we cannot refill any prescriptions through the ER. If you need further treatment from what is provided at your ER visit, please follow up with your primary care doctor or your pain management specialist.

## 2022-12-14 NOTE — ED Provider Notes (Signed)
[facility] EMERGENCY DEPARTMENT H&P      Visit date: 12/14/2022      CLINICAL SUMMARY           Diagnosis:    .     Final diagnoses:   Pain in both wrists         MDM Notes:      Patient is a 77 y.o. female who presents with concerns of pain in both her wrists after lifting her brother.  On initial evaluation, patients vitals were within normal limits.  Patient neurovascular intact on examination    Differential diagnose includes but not limited to to overuse injury versus sprain versus fracture versus dislocation    Imaging:   X-ray imaging concerning for arthritis.  No acute osseous injury.    On re-evaluation, patient sinew to appear well on examination.  I recommended using local splints on both wrists for comfort is much as needed especially sleeping.  Patient states she has both splints at home and will use them more frequently.  Outpatient follow-up with orthopedics and PCP recommended.  Strict return precautions provided.  She is agreeable understand with plan      Amount and/or Complexity of Data Reviewed  Clinical lab tests: ordered and reviewed  Tests in the radiology section of CPT: ordered and reviewed  Tests in the medicine section of CPT: ordered and reviewed   Review and summarize past medical records: yes I looked up the patient in our electronic medical record   Independent visualization of images, tracings, or specimens: yes  Discuss the patient with other providers:no       Medical Decision Making  Amount and/or Complexity of Data Reviewed  Radiology: ordered.              Disposition:      Discharge         Discharge Prescriptions    None         The patient was deemed stable for discharge. They were given strict return precautions as it relates to their presumed diagnosis, verbalized understanding of these precautions and agreed to follow up as instructed. All questions were answered prior to discharge.                CLINICAL INFORMATION        HPI:      Chief  Complaint: Wrist Pain (Bilateral/)  .    Adrienne Wang is a 77 y.o. female, with PMH of diabetes, who presents with sharp pain in both wrist since this morning after helping her brother. Patient reports using ACE bandage to help with wrist pain earlier.  Denies any direct falls on her hands.  Denies any numbness, weakness fevers coughs or colds or obvious deformity.    History obtained from: Patient          ROS:      Positive and negative ROS elements as per HPI.  All other systems reviewed and negative.      Physical Exam:      Pulse (!) 55  BP 137/79  Resp 18  SpO2 99 %  Temp 97.7 F (36.5 C)    Physical Exam  Constitutional:       Appearance: Normal appearance.   HENT:      Head: Normocephalic and atraumatic.   Eyes:      Pupils: Pupils are equal, round, and reactive to light.   Cardiovascular:      Rate and Rhythm: Normal rate and regular  rhythm.   Pulmonary:      Effort: Pulmonary effort is normal. No respiratory distress.   Musculoskeletal:         General: Normal range of motion.      Right wrist: No deformity or snuff box tenderness. Normal range of motion.      Left wrist: No deformity or snuff box tenderness. Normal range of motion.      Right hand: No deformity. Normal range of motion. Normal capillary refill.      Left hand: No deformity. Normal range of motion. Normal capillary refill.      Comments: No pain with axial loading of thumbs. Positive Phalen's test.    Skin:     General: Skin is warm.      Capillary Refill: Capillary refill takes less than 2 seconds.   Neurological:      Mental Status: She is alert and oriented to person, place, and time.                 PAST HISTORY        Primary Care Provider: Pcp, None, MD        PMH/PSH:    .     Medical History[1]    She has a past surgical history that includes Cataract extraction, bilateral (2013).      Social/Family History:      She reports that she has never smoked. She has never used smokeless tobacco. She reports that she does not drink  alcohol and does not use drugs.    Family History[2]      Listed Medications on Arrival:    .     Home Medications       Med List Status: Complete Set By: Doreene Adas, RN at 12/14/2022  5:42 PM          Flagged for Removal               Diphenhydramine-Zinc Acetate (BENADRYL ITCH RELIEF EX)     Apply topically.     Guaifenesin-Codeine (ROBITUSSIN W CODEINE) 100-10 MG/5ML syrup     Take 5 mLs by mouth 3 (three) times daily as needed for Cough.     Hydrocortisone (CVS CORTISONE INTENSIVE HEAL EX)     Apply topically.     pravastatin (PRAVACHOL) 10 MG tablet     Take 1 tablet (10 mg) by mouth daily           Allergies: She is allergic to aspirin.            VISIT INFORMATION        Clinical Course in the ED:                 Medications Given in the ED:    .     ED Medication Orders (From admission, onward)      None              Procedures:      Procedures      Interpretations:      O2 Sat:  The patient's oxygen saturation was 99 % on room air. This was independently interpreted by me as Normal.                RESULTS        Lab Results:      Results       ** No results found for the last 24 hours. **  Radiology Results:      Wrist Right PA Lateral and Oblique   Final Result      1.No acute fracture or dislocation.      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 Mild degenerative   change of the STT joint.      3.Osteopenia.      Gustavus Messing, MD   12/14/2022 6:51 PM      Wrist Left PA Lateral and Oblique   Final Result      1.No acute fracture or dislocation.      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 Mild degenerative   change of the STT joint.      3.Osteopenia.      Gustavus Messing, MD   12/14/2022 6:51 PM                  Scribe Attestation:    I was acting as a Neurosurgeon for Ricca Melgarejo MD on [employer]      I was acting as a Neurosurgeon for [employer], M.D. on [place] R        I am the first provider for this patient and I personally  performed the services documented.  is scribing for me on [place],Daw[place] R. This note accurately reflects work and decisions made by me.  Veniamin Kincaid, M.D.     Parts of this note were generated by the Epic EMR system/ Dragon speech recognition and may contain inherent errors or omissions not intended by the user. Grammatical errors, random word insertions, deletions, pronoun errors and incomplete sentences are occasional consequences of this technology due to software limitations. Not all errors are caught or corrected. If there are questions or concerns about the content of this note or information contained within the body of this dictation they should be addressed directly with the author for clarification.         [1]   Past Medical History:  Diagnosis Date    Diabetes mellitus     Pinched nerve     lumbar    [2]   Family History  Problem Relation Name Age of Onset    Heart disease Mother      Heart disease Father          Resa Rinks, Lamount Cranker, MD  12/14/22 [phone]

## 2023-01-08 ENCOUNTER — Ambulatory Visit (INDEPENDENT_AMBULATORY_CARE_PROVIDER_SITE_OTHER): Payer: Medicare Other | Admitting: Family Medicine

## 2023-01-19 ENCOUNTER — Ambulatory Visit (INDEPENDENT_AMBULATORY_CARE_PROVIDER_SITE_OTHER): Payer: Medicare Other | Admitting: Family Medicine

## 2023-01-19 ENCOUNTER — Encounter (INDEPENDENT_AMBULATORY_CARE_PROVIDER_SITE_OTHER): Payer: Self-pay | Admitting: Family Medicine

## 2023-01-19 VITALS — BP 136/70 | HR 53 | Ht 59.0 in | Wt 130.0 lb

## 2023-01-19 DIAGNOSIS — M19031 Primary osteoarthritis, right wrist: Secondary | ICD-10-CM

## 2023-01-19 DIAGNOSIS — M19032 Primary osteoarthritis, left wrist: Secondary | ICD-10-CM

## 2023-01-19 DIAGNOSIS — M170 Bilateral primary osteoarthritis of knee: Secondary | ICD-10-CM

## 2023-01-19 MED ORDER — DICLOFENAC SODIUM 1 % EX GEL
2.0000 g | Freq: Four times a day (QID) | CUTANEOUS | 2 refills | Status: AC
Start: 2023-01-19 — End: 2023-02-03

## 2023-01-19 NOTE — Progress Notes (Signed)
Orthopaedic New Patient Visit    Chief Complaint   Patient presents with    Wrist Pain     Bilateral wrist pain       HPI: Adrienne Wang is a 77 y.o. female comes in for evaluation of bilateral wrist pain states that she had injury sustained back in August was evaluated at the emergency department she was lifting.  With CAD.  She thinks she may have sprained her wrist pain with twisting here in the different movements with raising sharp pain and weakness physician in the clinic would be related to just overuse and strain seeking she is seeking a second opinion has been placed into some wrist splints which has been helpful for the patient the pain depends on the improvement she is currently not taking any medications for this.  Here for evaluation and treatment options    ROS: Denies fevers, chills, chest pain, shortness of breath, nausea, vomiting, or diarrhea.    Patient History:  There are no active problems to display for this patient.      Current Medications[1]  .  Allergies: Aspirin  Medical History[2]  Past Surgical History[3]  Family History[4]  Social History     Tobacco Use    Smoking status: Never    Smokeless tobacco: Never   Substance Use Topics    Alcohol use: No       In addition to findings reviewed in EPIC, relevant Medical/Family/Social History noted in today's HPI, otherwise are noncontributory to today's visit.    Physical Exam: Pt is a well developed, well nourished 77 y.o. year old female who is awake, alert, and oriented.  The patient has a normal affect and is in no acute distress.  Vitals:    01/19/23 1335   BP: 136/70   Pulse: (!) 53       Gait: normal  Constitutional: Pt is well-developed, well-nourished, and in no distress.   HENT:   Head: Normocephalic and atraumatic.   Eyes: Conjunctivae are normal.   Neck: Neck supple.   Cardiovascular: Normal rate  Pulmonary/Chest: Effort normal.   Neurological: Pt is alert and oriented to person, place, and time.   Skin: Skin is warm and dry. No  rash noted. Pt is not diaphoretic.   Psychiatric: Affect normal.     Left, Right Wrist  Observation:  abnormal; + swelling or deformity; + apparent soft tissue edema:  dorsal  Range of motion: Dorsiflexion 70 Deg; Palmar flexion 80 Deg; Radial deviation 20 Deg; Ulnar deviation 30 Deg; Supination 60 Deg; Pronation 60 Deg  Palpation:  nontender at Radial Styloid, Ulnar Styloid, Snuffbox, 1st CMC joint, Distal Radius Shaft, Distal Ulna Shaft, TFCC, Dorsal Lunate, DRUJ  UCL stress testing:  Negative  RCL stress testing:  Negative  Strength testing: Globally  - 5 / 5                                  Wrist Extension  - 5 / 5 does reproduce pain                                 Wrist Flexion   - 5 / 5 does reproduce pain  Wrist Supination - 5 / 5                                  Wrist Pronation   - 5 / 5                                  Grip strength   - 5 / 5   Finkelstein's:  Negative  Adductor Pollicis Stress Test:  Negative  Watson's Test:  Negative  TFCC Grind:  Negative  Push-off Test for TFCC:  Negative  Phalen:  Negative  Tinel's:  Negative  Distal Sensory:  normal  Distal pulses:  intact  Skin & Nails:  normal    For comparison, their contralateral extremity is normal on exam with no pain nor crepitus.    Radiology:     Xrays taken in clinic today:     Impression:   Encounter Diagnoses   Name Primary?    Localized primary osteoarthritis of wrists, bilateral Yes    Bilateral primary osteoarthritis of knee          Plan:   The patient's diagnosis and treatment options were discussed in detail at today's visit. I have taken this opportunity to refer the patient to formal physical therapy. I have asked the patient to avoid activities that exacerbate or worsen his symptoms.  We reviewed the standard conservative management of the diagnosis as well as the use of prescription medications for pain and inflammation as well as Tylenol and OTC NSAIDS.  We discussed the role of injection therapy  in management and its potential role in diagnosis and short term pain relief.  All questions were answered to the patient's satisfaction.  The patient will notify my clinic of any changes or worsening of their symptoms during the interim.      Continue with splints    Voltaren gel ordered    F/u in 8 weeks  45 minutes was spent in care of the patient at today's visit    Preparing for the visit (such as reviewing tests, notes, x-rays)  Getting and/or reviewing a history that was separately obtained  Performing the exam  Counseling and providing education to the patient, family, or caregiver  Documenting information in the medical record  Interpreting results and sharing that information with the patient, family or caregiver  Ordering medicines, tests, or procedures  Communicating with other healthcare professionals  Care coordination     This note may have been generated in part or in whole within the EPIC EMR using Dragon medical speech recognition software and may contain inherent errors or omissions not intended by the user. Grammatical and punctuation errors, random word insertions, deletions, pronoun errors and incomplete sentences are occasional consequences of this technology due to software limitations. Not all errors are caught or corrected.  Although every attempt is made to root out erroneus and incomplete transcription, the note may still not fully represent the intent or opinion of the author. If there are questions or concerns about the content of this note or information contained within the body of this dictation they should be addressed directly with the author for clarification.         [1]   Current Outpatient Medications   Medication Sig Dispense Refill    diclofenac Sodium (VOLTAREN) 1 % Gel topical gel Apply 2 g topically 4 (  four) times daily for 15 days 100 g 2    Diphenhydramine-Zinc Acetate (BENADRYL ITCH RELIEF EX) Apply topically. (Patient not taking: Reported on 01/19/2023)       Guaifenesin-Codeine (ROBITUSSIN W CODEINE) 100-10 MG/5ML syrup Take 5 mLs by mouth 3 (three) times daily as needed for Cough. (Patient not taking: Reported on 01/19/2023) 120 mL 0    Hydrocortisone ([employer] CORTISONE INTENSIVE HEAL EX) Apply topically. (Patient not taking: Reported on 01/19/2023)      pravastatin (PRAVACHOL) 10 MG tablet Take 1 tablet (10 mg) by mouth daily (Patient not taking: Reported on 01/19/2023)       No current facility-administered medications for this visit.   [2]   Past Medical History:  Diagnosis Date    Diabetes mellitus     Pinched nerve     lumbar    [3]   Past Surgical History:  Procedure Laterality Date    CATARACT EXTRACTION, BILATERAL  2013   [4]   Family History  Problem Relation Name Age of Onset    Heart disease Mother      Heart disease Father

## 2023-03-05 ENCOUNTER — Ambulatory Visit (INDEPENDENT_AMBULATORY_CARE_PROVIDER_SITE_OTHER): Payer: Commercial Managed Care - HMO | Admitting: Family Medicine

## 2023-07-08 ENCOUNTER — Ambulatory Visit (INDEPENDENT_AMBULATORY_CARE_PROVIDER_SITE_OTHER): Admitting: Internal Medicine

## 2023-07-08 ENCOUNTER — Encounter (INDEPENDENT_AMBULATORY_CARE_PROVIDER_SITE_OTHER): Payer: Self-pay | Admitting: Internal Medicine

## 2023-07-08 VITALS — BP 131/70 | HR 62 | Temp 97.6°F | Wt 126.4 lb

## 2023-07-08 DIAGNOSIS — R42 Dizziness and giddiness: Secondary | ICD-10-CM

## 2023-07-08 DIAGNOSIS — E119 Type 2 diabetes mellitus without complications: Secondary | ICD-10-CM

## 2023-07-08 DIAGNOSIS — Z0000 Encounter for general adult medical examination without abnormal findings: Secondary | ICD-10-CM

## 2023-07-08 DIAGNOSIS — Z1231 Encounter for screening mammogram for malignant neoplasm of breast: Secondary | ICD-10-CM

## 2023-07-08 NOTE — Patient Instructions (Signed)
 MEDICARE WELLNESS PERSONAL PREVENTION PLAN   As part of the Medicare Wellness portion of your visit today, we are providing you with this personalized preventative plan of care. The list below includes many common screening recommendations from the USPSTF ([place]  Preventive Services Task Force) but is not meant to be comprehensive. You may be eligible for other preventative services depending upon your personal risk factors.     Health Maintenance   Topic Date Due    PAP SMEAR  Never done    COVID-19 Vaccine (4 - 2024-25 season) 11/23/2022    DEPRESSION SCREENING  07/07/2024     Health Maintenance Topics with due status: Overdue       Topic Date Due    PAP SMEAR Never done    COVID-19 Vaccine 11/23/2022      Immunization History   Administered Date(s) Administered    COVID-19 mRNA MONOVALENT vaccine PRIMARY SERIES 12 years and above (Moderna) 100 mcg/0.5 mL 06/30/2019, 07/28/2019    COVID-19 mRNA MONOVALENT vaccine PRIMARY SERIES 12 years and above [employer]) 30 mcg/0.3 mL (DILUTE BEFORE USE) 03/14/2020        Your major risk factors:   Recommendations for improvement:      Colorectal Cancer Screening - All adults 45-75 yrs should undergo periodic colorectal cancer screening. The decision to screen for colorectal cancer in adults aged 72 to 30 years should be an individual one, taking into account your overall health and prior screening history.   Breast Cancer Screening - Women aged 40-27yrs should have mammograms every other year (please note that this recommendation may not be appropriate for every woman - your physician can answer specific questions you may have). The USPSTF concludes that the current evidence is insufficient to assess the balance of benefits and harms of screening mammography in women aged 64 years or older.  These recommendations do not apply to persons who have a genetic marker or syndrome associated with a high risk of breast cancer (eg, BRCA1 or BRCA2 genetic variation), a history of  high-dose radiation therapy to the chest at a young age, or previous breast cancer or a high-risk breast lesion on previous biopsies.   Cervical Cancer Screening - Women over 75 do not require pap smears as long as prior screening has been normal and are not otherwise at high risk for cervical cancer. The USPSTF recommends screening for cervical cancer every 3 years with cervical cytology alone in women aged 76 to 46 years. For women aged 74 to 29 years, the USPSTF recommends screening every 3 years with cervical cytology alone, every 5 years with high-risk human papillomavirus (hrHPV) testing alone, or every 5 years with hrHPV testing in combination with cytology (cotesting).   Osteoporosis Screening -  The USPSTF recommends screening for osteoporosis with bone measurement testing to prevent osteoporotic fractures in women 65 years and older.  For postmenopausal women younger than 65 years who are at increased risk of osteoporosis, as determined by a formal clinical risk assessment tool, the USPSTF recommends screening for osteoporosis with bone measurement testing to prevent osteoporotic fractures.   Hepatitis C Screening - Recommend screening for hepatitis C virus (HCV) infection in all adults aged 13 to 46 years.  Lung cancer Screening - Recommend annual screening for lung cancer with low-dose computed tomography (LDCT) in adults ages 76 to 2 years who have a 20 pack-year smoking history and currently smoke or have quit within the past 15 years.  Recommended Vaccinations from the CDC ([place]  Centers  for Disease Control and Prevention)   Influenza one dose annually   COVID vaccine - stay current with the most up-to-date COVID update/booster  Tetanus/diphtheria (Tdap) one booster every 10 years   Zoster/Shingles - (Shingrix) two doses after age 29 (second dose given 2-6 months after first dose)  Pneumococcal 20-valent or 21-valent conjugate vaccine - one dose for adults aged >=65 years with no history of prior  pneumococcal vaccination.  Pneumococcal 20-valent or 21-valent conjugate vaccine -  one dose for adults aged >=65 years with PPSV23 only or PCV13 only given more than 1 yr ago.  Pneumococcal 20-valent or 21-valent conjugate vaccine - one dose for adults aged >=65 years who have completed PCV-13 and PPSV23 after 78yrs old and it has been greater than 35yrs (shared clinical decision-making is recommended regarding administration of this vaccine).   RSV (Respiratory Syncytial Virus) vaccine for everyone ages 8 and older  RSV (Respiratory Syncytial Virus)  vaccine ages 3-74 who are at increased risk of severe RSV disease (for example, chronic heart/lung/liver/kidney disease, immunocompromised)    PERSONAL PREVENTION PLAN   Your Personal Prevention Plan is based on your overall health and your responses to the health questionnaire you completed. The following information is for you to review in addition to the recommendations, referrals, and tests we have discussed at your visit.     Physical Activity:   Physical activity can help you maintain a healthy weight, prevent or control illness, reduce stress, and sleep better. It can also help you improve your balance to avoid falls. Try to build up to and maintain a total of 30 minutes of activity each day. If you are able, try walking, doing yard or housework, and taking the stairs more often. You can also strengthen your muscles with exercises done while sitting or lying down. Web resource - https://www.carpenter-henry.info/  Emotional Health:   Feeling "down in the dumps" or anxious every now and then is a natural part of life. If this feeling lasts for a few weeks or more, talk with me as soon as possible. It could be a sign of a problem that needs treatment. There are many types of treatment available. Web resource -  RXPreview.de  Falls:   You can reduce your risk of falling by making changes in your home. Remove items that may cause  tripping, improve lighting, and consider installing grab bars.   Talk with me if you have problems with balance and walking. To prevent falls, you may need your vision, hearing, or blood pressure checked. Exercises to improve your strength and balance, or using a cane or walker, may help. Review your medicines with me at every visit, because some can affect balance. Please be sure to let me know if you fall or are fearful you may fall. Web resource - BounceThru.fi  Urinary Leakage:   Urine leakage is common, but it is not a normal part of aging. Talk with me about any urine leakage so that the cause can be found and treated. Treatment can include bladder training, exercises, medicine or surgery.   Pain:   We all have aches and pains at times, but chronic pain can change how you feel and live every day. Please talk with me about any symptoms of chronic pain so that we can determine how best to treat.   Sleep:   Getting a good night's sleep is vital to your health and well-being and can help prevent or manage health problems. Often, sleep can be improved by  changing behaviors, including when you go to bed and what you do before bed. Sleep apnea can cause problems such as struggling to stay awake during the day. Please let me know if you would like to learn more about improving your sleep and/or think you may have sleep apnea. Web resource -  ThousandQuestions.com.cy  Seat Belt:   Please remember to wear a seat belt when driving or riding in a vehicle. It is one of the most important things you can do to stay safe in a car.   Nutrition:   Remember to eat plenty of fruits, vegetables, whole grains, and dairy. Drink at least 64 ounces (8 full glasses) of water a day, unless you have been advised to limit fluids.  Web resource- PickSeat.dk  Alcohol:   Alcohol can have a greater effect on older people, who may feel its effects  at a lower amount. Older people should limit alcoholic drinks (no more than one a day for women and no more than two a day for men). Please let me know if alcohol use becomes a problem.  Web resource - AgingMortgage.ca  Tobacco:   Not smoking or using other forms of tobacco is one of the most important things you can do for your health. Here is some more information about the importance of quitting smoking and how to quit smoking - Mudlogger - BroadJournal.com.pt  Advance Directives:   There may come a time when medical decisions need to be made on your behalf. Please talk with your family, and with me, about your wishes. It is important to provide information about your decisions, and any formal advance directives, for your medical record. Here is additional information on advanced directives - Web resource - MediaExhibitions.no  Additional Support:   Sometimes it can be challenging to manage all aspects of daily life. Finding the right support can help you maintain or improve your health and independence. Please let me know if you would like to talk further about finding resources to assist you.

## 2023-07-08 NOTE — Progress Notes (Signed)
 [facility]   Medicare Wellness Visit                 Adrienne Wang is a 78 y.o. female who presents today for the following Medicare Wellness Visit:  []  Initial Preventive Physical Exam (IPPE) - "Welcome to Medicare" preventive visit (Vision Screening required)   [x]  Annual Wellness Visit - Initial  []  Annual Wellness Visit - Subsequent  78 y/o F with history of DM (diet controlled) hee for medicare wellness exam. She report she has been generally well except that she has occasionally felt light headed and lost her balance. Denies any loss of conscious, no palpitation, chest pain, no diplopia no sense of room spinning.                                                                                                                                                 Health Risk Assessment   During the past month, how would you rate your general health?:  Fair  Which of the following tasks can you do without assistance - drive or take the bus alone; shop for groceries or clothes; prepare your own meals; do your own housework/laundry; handle your own finances/pay bills; eat, bathe or get around your home?: Drive or take the bus alone, Eat, bathe, dress or get around your home, Shop for groceries or clothes, Prepare your own meals, Do your own housework/laundry, Handle your own finances/pay bills  Which of the following problems have you been bothered by in the past month - dizzy when standing up; problems using the phone; feeling tired or fatigued; moderate or severe body pain?: Dizzy when standing up, Feeling tired or fatigued  Do you exercise for about 20 minutes 3 or more days per week?:No  During the past month was someone available to help if you needed and wanted help?  For example, if you felt nervous, lonely, got sick and had to stay in bed, needed someone to talk to, needed help with daily chores or needed help just taking care of yourself.: Yes  Do you always wear a seat belt?: Yes  Do you have any  trouble taking medications the way you have been told to take them?: No  Have you been given any information that can help you with keeping track of your medications?: No  Do you have trouble paying for your medications?: No  Have you been given any information that can help you with hazards in your house, such as scatter rugs, furniture, etc?: No  Do you feel unsteady when standing or walking?: Yes  Do you worry about falling?: Yes  Have you fallen two or more times in the past year?: Yes  Did you suffer any injuries from your falls in the past year?: No    Hospitalizations   Hospitalization within past year: [x]  No  []   Yes     Diagnosis:    Screenings         07/08/2023   Ambulatory Screenings   Falls Risk: Adrienne Wang more than 2 times in past year Y    Y   Falls Risk: Suffer any injuries? N    N   Depression: PHQ2 Total Score 0   Depression: PHQ9 Total Score 0       Multiple values from one day are sorted in reverse-chronological order        Substance Use Disorder Screen:  In the past year, how often have you used the following?  1) Alcohol (For men, 5 or more drinks a day. For women, 4 or more drinks a day)  [x]  Never []  Once or Twice []  Monthly []  Weekly []  Daily or Almost Daily  2) Tobacco Products  [x]  Never []  Once or Twice []  Monthly []  Weekly []  Daily or Almost Daily  3) Prescription Drugs for Non-Medical Reasons  [x]  Never []  Once or Twice []  Monthly []  Weekly []  Daily or Almost Daily  4) Illegal Drugs  [x]  Never []  Once or Twice []  Monthly []  Weekly []  Daily or Almost Daily            Functional Ability/Level of Safety   Falls Risk/Home Safety Assessment:  (see HRA and Screenings sections for additional assessment)    Home Safety:   []  Low Risk for Falls  []  Skid-resistant rugs/remove throw rugs   []  Grab bars    [x]  Clear pathways between rooms    [x]  Proper lighting stairs/ bathrooms/bedrooms    Get Up and Go (optional):    [x]   <20 secs    []   >20 secs    []   High risk for falls - Home Safety/Falls Risk  Precautions reviewed with pt/family    Hearing Assessment:  Concerns for hearing loss: []  Yes  [x]   No  Hearing aids:   []   Right  []   Left  []   Bilateral   []   None  Whisper Test (optional):  []  Normal  []   Slightly decreased  []   Significantly decreased    Visual Acuity:     If no visual acuity exam above:  [x] Patient Declines  []  Up to date with Optometry/Ophthalmology per patient report.    Exercise:  Frequency:  [x]   No formal exercise  []   1-2x/wk  []   3-4x/wk  []   >4x/wk  Duration:  []   15-30 mins/day  []   30-45 mins/day  []   45+ mins/day  Intensity:  []   Light  []   Moderate  []   Heavy    Diet:  Diet: - consumes a well balanced diet  - compliant with heart healthy and well balanced diet      Activities of Daily Living     ADL's Independent Minimal  Assistance Moderate  Assistance Total   Assistance   Bathing [x]  []  []  []    Dressing [x]  []  []  []    Mobility   [x]  []  []  []    Transfer [x]  []  []  []    Eating [x]  []  []  []    Toileting [x]  []  []  []      IADL's Independent Minimal  Assistance Moderate  Assistance Total   Assistance   Phone [x]  []  []  []    Housekeeping [x]  []  []  []    Laundry [x]  []  []  []    Transportation [x]  []  []  []    Medications [x]  []  []  []    Finances [x]  []  []  []   ADL assistance:   [x]  No assistance needed    []  Spouse    []  Sibling    []  Son   []  Daughter   []  Children    []  Home Health Aide   []  Other:    Social Activities/Engagement   Frequency of Communication with Friends and Family:  []  Never []  Rarely [x]  Sometimes []  Often[]  Very often    Frequency of Social Gatherings with Friends and Family:   []  Never []  Rarely [x]  Sometimes []  Often[]  Very often    Advanced Care Planning   Discussion of Advance Directives:   []  Advance Directive in chart    []  Advance Directive not in chart - requested to provide   []  No Advance Directive.  Form Provided    []  No Advance Directive.  Pt declines.   [x]  Not addressed today    []  Other:         Exam   BP 131/70 (BP Site: Right arm, Patient Position:  Sitting)   Pulse 62   Temp 97.6 F (36.4 C)   Wt 57.3 kg (126 lb 6.4 oz)   SpO2 98%   BMI 25.53 kg/m   General Examination:   GENERAL APPEARANCE: alert, in no acute distress, well developed, well nourished, oriented to time, place, and person.   HEAD: normal appearance, atraumatic.   EYES: extraocular movement intact (EOMI), pupils equal, round, reactive to light and accommodation, sclera anicteric, conjunctiva clear.   EARS: tympanic membranes normal bilaterally, external canals normal .   NOSE: normal nasal mucosa, no lesions.   ORAL CAVITY: normal oropharynx, normal lips, mucosa moist, no lesions.    NECK: neck supple,no cervical lymphadenopathy, no neck mass palpated, no jugular venous distention, no thyromegaly.    HEART: S1, S2 normal, no murmurs, rubs, gallops, regular rate and rhythm.   LUNGS: normal effort / no distress, normal breath sounds, clear to auscultation bilaterally, no wheezes, rales, rhonchi.   ABDOMEN: bowel sounds present, no hepatosplenomegaly, soft, nontender, nondistended.   EXTREMITIES: no edema, no clubbing, cyanosis, or edema. Pulse 2+ B/L  MUSCULOSKELETAL: full range of motion, no swelling or deformity.    NEUROLOGIC: nonfocal, cranial nerves 2-12 grossly intact, deep tendon reflexes 2+ symmetrical, normal strength, tone and reflexes, sensory exam intact.   PSYCH: cognitive function intact, mood/affect full range, speech clear.           Evaluation of Cognitive Function   Mood/affect: [x]  Appropriate  []   Other:   Appearance: [x]  Neatly groomed  [x]  Adequately nourished  []  Other:  Family member/caregiver input: []  Present - no concerns  []   Not present in room  []  Present - concerns:    Cognitive Assessment:  Mini-Cog Result (three word registration- banana, sunrise, chair / clock drawing):   [x]   > 3 points - negative screen for dementia   []  3 recalled words - negative screen for dementia   []  1-2 recalled words and normal clock draw - negative for cognitive impairment   []   1-2 recalled words and abnormal clock draw - positive for cognitive impairment   []  0 recalled words - positive for cognitive impairment         Personalized Prevention Plan   The patient was provided with a personalized prevention plan via   the Patient Instructions tab of this visit  Assessment/Plan     Assessment & Plan  General medical exam         Diabetes mellitus without complication (CMS/HCC)    Orders:    Lipid Panel; Future    Urine Microalbumin, Random; Future    Comprehensive Metabolic Panel; Future    Hemoglobin A1C; Future    Disequilibrium  Recommend PT to help with her balance f/u in 4-6 weeks.  Orders:    Ambulatory referral to Physical Therapy; Future    Encounter for screening mammogram for malignant neoplasm of breast    Orders:    Mammo Screening 3D/Tomo Bilateral; Future          History/Care Team   Patient Care Team:  Pcp, None, MD as PCP - General  [x]  Medical, surgical, family history reviewed and updated during this encounter  [x]  Medication list updated and reconciled during this encounter    Additional Documentation

## 2023-07-08 NOTE — Progress Notes (Signed)
  [facility]   Medicare Wellness Visit                 Adrienne Wang is a 78 y.o. female who presents today for the following Medicare Wellness Visit:  []  Initial Preventive Physical Exam (IPPE) - "Welcome to Medicare" preventive visit (Vision Screening required)   []  Annual Wellness Visit - Initial  [x]  Annual Wellness Visit - Subsequent                                                                                                                                                 Health Risk Assessment   During the past month, how would you rate your general health?:  Fair  Which of the following tasks can you do without assistance - drive or take the bus alone; shop for groceries or clothes; prepare your own meals; do your own housework/laundry; handle your own finances/pay bills; eat, bathe or get around your home?: Drive or take the bus alone, Eat, bathe, dress or get around your home, Shop for groceries or clothes, Prepare your own meals, Do your own housework/laundry, Handle your own finances/pay bills  Which of the following problems have you been bothered by in the past month - dizzy when standing up; problems using the phone; feeling tired or fatigued; moderate or severe body pain?: Dizzy when standing up, Feeling tired or fatigued  Do you exercise for about 20 minutes 3 or more days per week?:No  During the past month was someone available to help if you needed and wanted help?  For example, if you felt nervous, lonely, got sick and had to stay in bed, needed someone to talk to, needed help with daily chores or needed help just taking care of yourself.: Yes  Do you always wear a seat belt?: Yes  Do you have any trouble taking medications the way you have been told to take them?: No  Have you been given any information that can help you with keeping track of your medications?: No  Do you have trouble paying for your medications?: No  Have you been given any information that can help you with hazards  in your house, such as scatter rugs, furniture, etc?: No  Do you feel unsteady when standing or walking?: Yes  Do you worry about falling?: Yes  Have you fallen two or more times in the past year?: Yes  Did you suffer any injuries from your falls in the past year?: No    Hospitalizations   Hospitalization within past year: [x]  No  []  Yes     Diagnosis:    Screenings         07/08/2023   Ambulatory Screenings   Falls Risk: Alfonse Angle more than 2 times in past year Y    Y   Falls Risk: Suffer any injuries? N  N   Depression: PHQ2 Total Score 0   Depression: PHQ9 Total Score 0       Multiple values from one day are sorted in reverse-chronological order        Substance Use Disorder Screen:  In the past year, how often have you used the following?  1) Alcohol (For men, 5 or more drinks a day. For women, 4 or more drinks a day)  [x]  Never []  Once or Twice []  Monthly []  Weekly []  Daily or Almost Daily  2) Tobacco Products  [x]  Never []  Once or Twice []  Monthly []  Weekly []  Daily or Almost Daily  3) Prescription Drugs for Non-Medical Reasons  [x]  Never []  Once or Twice []  Monthly []  Weekly []  Daily or Almost Daily  4) Illegal Drugs  [x]  Never []  Once or Twice []  Monthly []  Weekly []  Daily or Almost Daily            Functional Ability/Level of Safety   Falls Risk/Home Safety Assessment:  (see HRA and Screenings sections for additional assessment)    Home Safety:   []  Low Risk for Falls  []  Skid-resistant rugs/remove throw rugs   []  Grab bars    [x]  Clear pathways between rooms    [x]  Proper lighting stairs/ bathrooms/bedrooms    Get Up and Go (optional):    []   <20 secs    []   >20 secs    []   High risk for falls - Home Safety/Falls Risk Precautions reviewed with pt/family    Hearing Assessment:  Concerns for hearing loss: [x]  Yes  []   No  Hearing aids:   []   Right  []   Left  []   Bilateral   [x]   None  Whisper Test (optional):  []  Normal  []   Slightly decreased  []   Significantly decreased    Visual Acuity:     If no visual  acuity exam above:  [x] Patient Declines  []  Up to date with Optometry/Ophthalmology per patient report.    Exercise:  Frequency:  [x]   No formal exercise  []   1-2x/wk  []   3-4x/wk  []   >4x/wk  Duration:  []   15-30 mins/day  []   30-45 mins/day  []   45+ mins/day  Intensity:  []   Light  []   Moderate  []   Heavy    Diet:        Activities of Daily Living     ADL's Independent Minimal  Assistance Moderate  Assistance Total   Assistance   Bathing [x]  []  []  []    Dressing [x]  []  []  []    Mobility   [x]  []  []  []    Transfer [x]  []  []  []    Eating [x]  []  []  []    Toileting [x]  []  []  []      IADL's Independent Minimal  Assistance Moderate  Assistance Total   Assistance   Phone [x]  []  []  []    Housekeeping [x]  []  []  []    Laundry [x]  []  []  []    Transportation [x]  []  []  []    Medications [x]  []  []  []    Finances [x]  []  []  []       ADL assistance:   [x]  No assistance needed    []  Spouse    []  Sibling    []  Son   []  Daughter   []  Children    []  Home Health Aide   []  Other:    Social Activities/Engagement   Frequency of Communication with Friends and Family:  []  Never []  Rarely []  Sometimes []   Often[]  Very often    Frequency of Social Gatherings with Friends and Family:   []  Never []  Rarely []  Sometimes []  Often[]  Very often    Advanced Care Planning   Discussion of Advance Directives:   []  Advance Directive in chart    []  Advance Directive not in chart - requested to provide   []  No Advance Directive.  Form Provided    []  No Advance Directive.  Pt declines.   []  Not addressed today    []  Other:         Exam   BP 131/70 (BP Site: Right arm, Patient Position: Sitting)   Pulse 62   Temp 97.6 F (36.4 C)   Wt 57.3 kg (126 lb 6.4 oz)   SpO2 98%   BMI 25.53 kg/m   Physical Exam        Evaluation of Cognitive Function   Mood/affect: [x]  Appropriate  []   Other:   Appearance: [x]  Neatly groomed  [x]  Adequately nourished  []  Other:  Family member/caregiver input: []  Present - no concerns  []   Not present in room  []  Present -  concerns:    Cognitive Assessment:  Mini-Cog Result (three word registration- banana, sunrise, chair / clock drawing):   [x]   > 3 points - negative screen for dementia   []  3 recalled words - negative screen for dementia   []  1-2 recalled words and normal clock draw - negative for cognitive impairment   []  1-2 recalled words and abnormal clock draw - positive for cognitive impairment   []  0 recalled words - positive for cognitive impairment         Personalized Prevention Plan   The patient was provided with a personalized prevention plan via                                                                                                                                                    Assessment/Plan     Assessment & Plan          History/Care Team   Patient Care Team:  Pcp, None, MD as PCP - General  [x]  Medical, surgical, family history reviewed and updated during this encounter  [x]  Medication list updated and reconciled during this encounter    Additional Documentation

## 2023-07-09 ENCOUNTER — Ambulatory Visit (INDEPENDENT_AMBULATORY_CARE_PROVIDER_SITE_OTHER): Payer: Self-pay | Admitting: Internal Medicine

## 2023-07-09 LAB — LIPID PANEL
Cholesterol / HDL Ratio: 3.7 (calc) (ref <5.0)
Cholesterol: 249 mg/dL — ABNORMAL HIGH (ref <200)
HDL: 68 mg/dL (ref >=50)
LDL Calculated: 158 mg/dL — ABNORMAL HIGH
NON HDL CHOLESTEROL: 181 mg/dL — ABNORMAL HIGH (ref <130)
Triglycerides: 110 mg/dL (ref <150)

## 2023-07-09 LAB — COMPREHENSIVE METABOLIC PANEL
ALT: 13 U/L (ref 6–29)
AST (SGOT): 15 U/L (ref 10–35)
Albumin/Globulin Ratio: 1.5 (calc) (ref 1.0–2.5)
Albumin: 4.4 g/dL (ref 3.6–5.1)
Alkaline Phosphatase: 51 U/L (ref 37–153)
BUN / Creatinine Ratio: 44 (calc) — ABNORMAL HIGH (ref 6–22)
BUN: 30 mg/dL — ABNORMAL HIGH (ref 7–25)
Bilirubin, Total: 0.5 mg/dL (ref 0.2–1.2)
CO2: 26 mmol/L (ref 20–32)
Calcium: 9.7 mg/dL (ref 8.6–10.4)
Chloride: 105 mmol/L (ref 98–110)
Creatinine: 0.68 mg/dL (ref 0.60–1.00)
Globulin: 3 g/dL (ref 1.9–3.7)
Glucose: 82 mg/dL (ref 65–99)
Potassium: 4.2 mmol/L (ref 3.5–5.3)
Protein, Total: 7.4 g/dL (ref 6.1–8.1)
Sodium: 140 mmol/L (ref 135–146)
eGFR: 89 mL/min/{1.73_m2} (ref >=60)

## 2023-07-09 LAB — HEMOGLOBIN A1C: Hemoglobin A1C: 6.2 % — ABNORMAL HIGH (ref <5.7)

## 2023-07-09 LAB — URINE MICROALBUMIN, RANDOM
Microalbumin/Creatinine Ratio: 11 mg/g{creat} (ref <30)
Microalbumin: 1.1 mg/dL
Urine Creatinine, Random: 101 mg/dL (ref 20–275)

## 2023-07-10 ENCOUNTER — Telehealth (INDEPENDENT_AMBULATORY_CARE_PROVIDER_SITE_OTHER): Payer: Self-pay | Admitting: Internal Medicine

## 2023-07-10 NOTE — Telephone Encounter (Signed)
 Copied from CRM [id number]. Topic: Clinical Support - Speak With Nurse  >> Jul 10, 2023  2:22 PM Kaaren Ora T wrote:  Roderick Civatte R called about Clinical Support - Speak With Nurse.  Additional details:  Pt called office to return call    Callback#[phone]

## 2023-07-13 ENCOUNTER — Telehealth: Payer: Self-pay | Admitting: Internal Medicine

## 2023-07-13 NOTE — Telephone Encounter (Signed)
 Copied from CRM [id number]. Topic: Clinical Support - Speak With Nurse  >> Jul 13, 2023 10:08 AM Ryanna L wrote:  Wang, Adrienne R called about Clinical Support - Speak With Nurse.  Additional details:  Pt called requesting a callback from Dr.Shah, in reference to lab results.    Best contact [phone]

## 2023-07-13 NOTE — Telephone Encounter (Signed)
 Attempted to contact patient regarding labs. Patient was unavailable voicemail was left.
